# Patient Record
Sex: Male | Born: 1995 | Race: White | Hispanic: No | Marital: Single
Health system: Southern US, Community
[De-identification: ages and names within clinical notes are randomized; demographics above are authoritative.]

---

## 2016-08-21 ENCOUNTER — Emergency Department (HOSPITAL_COMMUNITY): Payer: No Typology Code available for payment source

## 2016-08-21 ENCOUNTER — Emergency Department (HOSPITAL_COMMUNITY)
Admission: EM | Admit: 2016-08-21 | Discharge: 2016-08-21 | Disposition: A | Discharge status: Home / Self Care | Payer: No Typology Code available for payment source | Attending: Emergency Medicine | Admitting: Emergency Medicine

## 2016-08-21 DIAGNOSIS — Y939 Activity, unspecified: Secondary | ICD-10-CM | POA: Diagnosis not present

## 2016-08-21 DIAGNOSIS — R42 Dizziness and giddiness: Secondary | ICD-10-CM | POA: Insufficient documentation

## 2016-08-21 DIAGNOSIS — S3992XA Unspecified injury of lower back, initial encounter: Secondary | ICD-10-CM | POA: Diagnosis not present

## 2016-08-21 DIAGNOSIS — S199XXA Unspecified injury of neck, initial encounter: Secondary | ICD-10-CM | POA: Diagnosis present

## 2016-08-21 DIAGNOSIS — S161XXA Strain of muscle, fascia and tendon at neck level, initial encounter: Secondary | ICD-10-CM

## 2016-08-21 DIAGNOSIS — Y999 Unspecified external cause status: Secondary | ICD-10-CM | POA: Insufficient documentation

## 2016-08-21 DIAGNOSIS — Y92410 Unspecified street and highway as the place of occurrence of the external cause: Secondary | ICD-10-CM | POA: Insufficient documentation

## 2016-08-21 MED ORDER — METHOCARBAMOL 500 MG PO TABS: 500.0000 mg | ORAL_TABLET | Freq: Two times a day (BID) | ORAL | 0 refills | Status: AC | PRN

## 2016-08-21 MED ORDER — IBUPROFEN 600 MG PO TABS: 600.0000 mg | ORAL_TABLET | Freq: Four times a day (QID) | ORAL | 0 refills | Status: AC | PRN

## 2016-08-21 NOTE — ED Notes (Signed)
Bed: [facility] Expected date:  Expected time:  Means of arrival:  Comments: 
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
## 2016-08-21 NOTE — ED Notes (Signed)
Bed: [facility] Expected date:  Expected time:  Means of arrival:  Comments: 
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
